# Patient Record
Sex: Female | Born: 1985 | Race: White | Hispanic: No | Marital: Married | Smoking: Former smoker
Health system: Southern US, Community
[De-identification: ages and names within clinical notes are randomized; demographics above are authoritative.]

## PROBLEM LIST (undated history)

## (undated) DIAGNOSIS — Z789 Other specified health status: Secondary | ICD-10-CM

## (undated) DIAGNOSIS — O4400 Placenta previa specified as without hemorrhage, unspecified trimester: Secondary | ICD-10-CM

## (undated) HISTORY — PX: NO PAST SURGERIES: SHX2092

## (undated) SURGERY — Surgical Case
Anesthesia: Regional

---
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
## 2008-05-11 ENCOUNTER — Emergency Department (HOSPITAL_COMMUNITY): Admission: EM | Admit: 2008-05-11 | Discharge: 2008-05-11 | Payer: Self-pay | Admitting: Emergency Medicine

## 2008-06-22 IMAGING — CR DG CHEST 2V
2 series · 2 of 2 positions shown · non-contrast
Comparison: None.

CLINICAL DATA: Chills, fever, cough. 
 CHEST - 2 VIEW:

[view not recorded (1 of 2)]
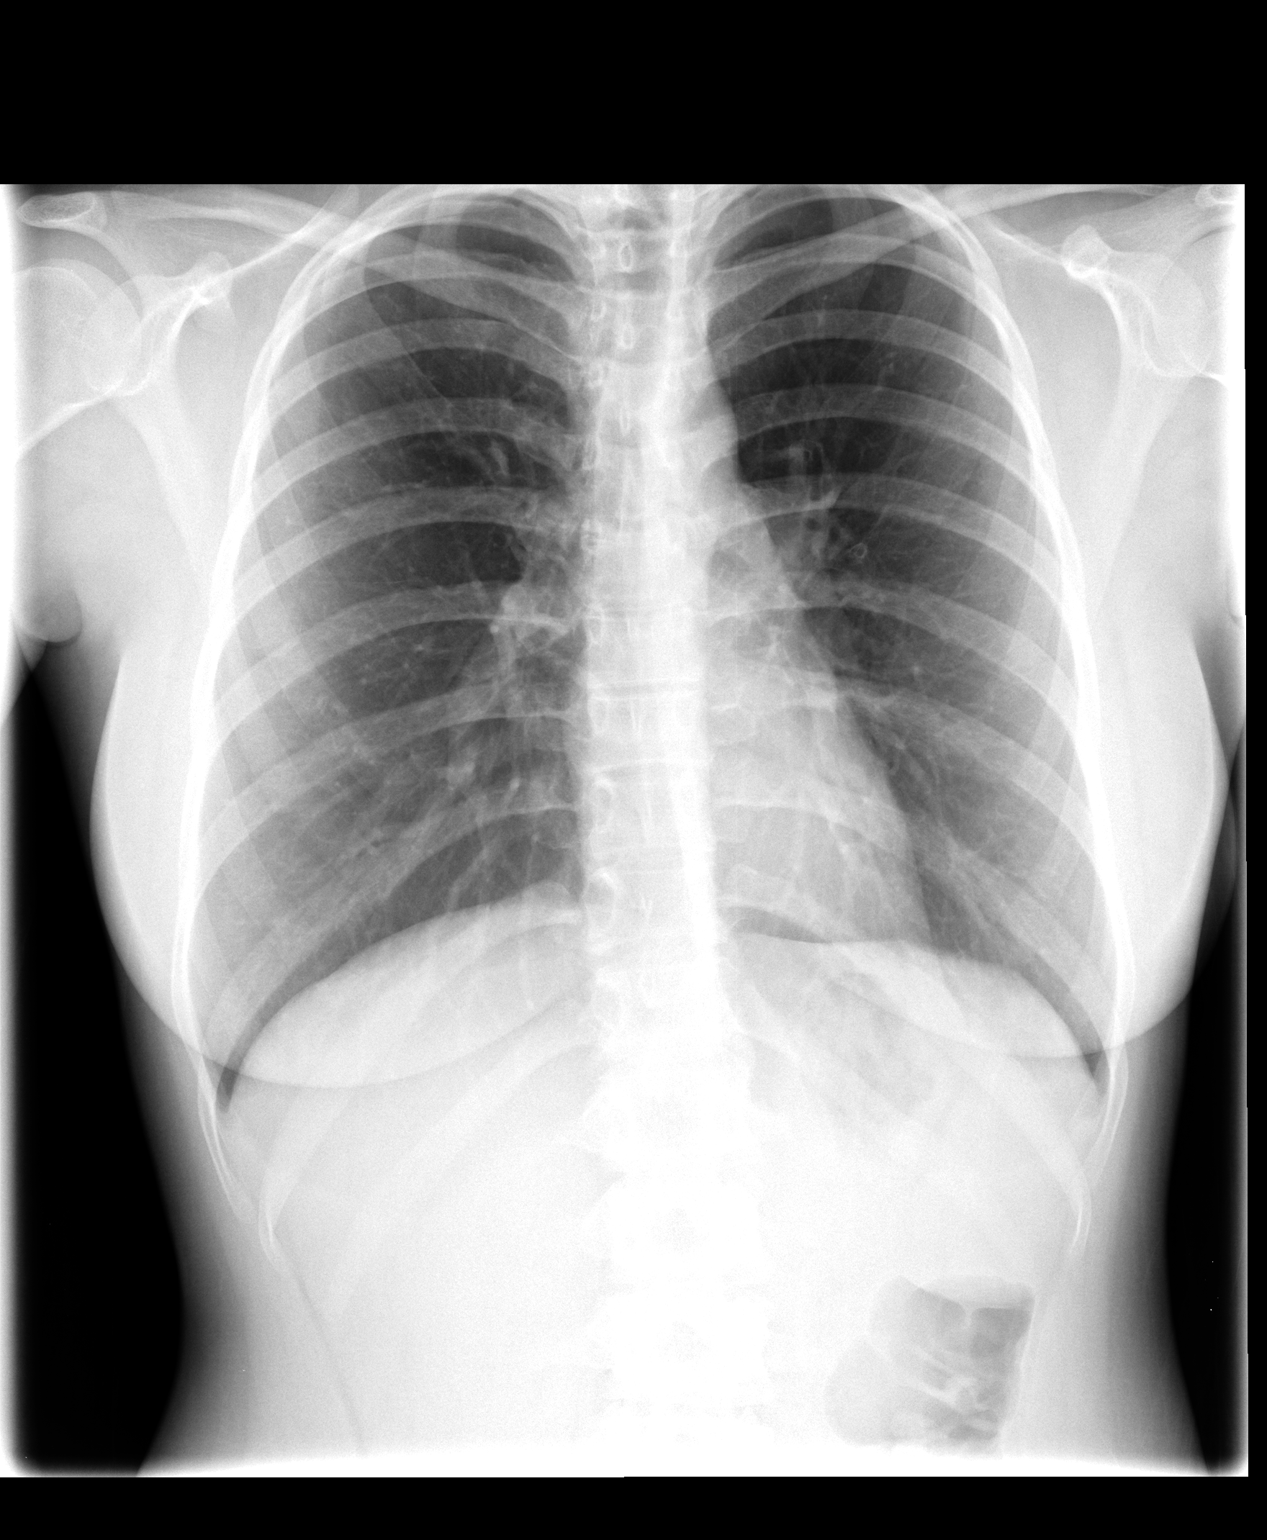

[view not recorded (2 of 2)]
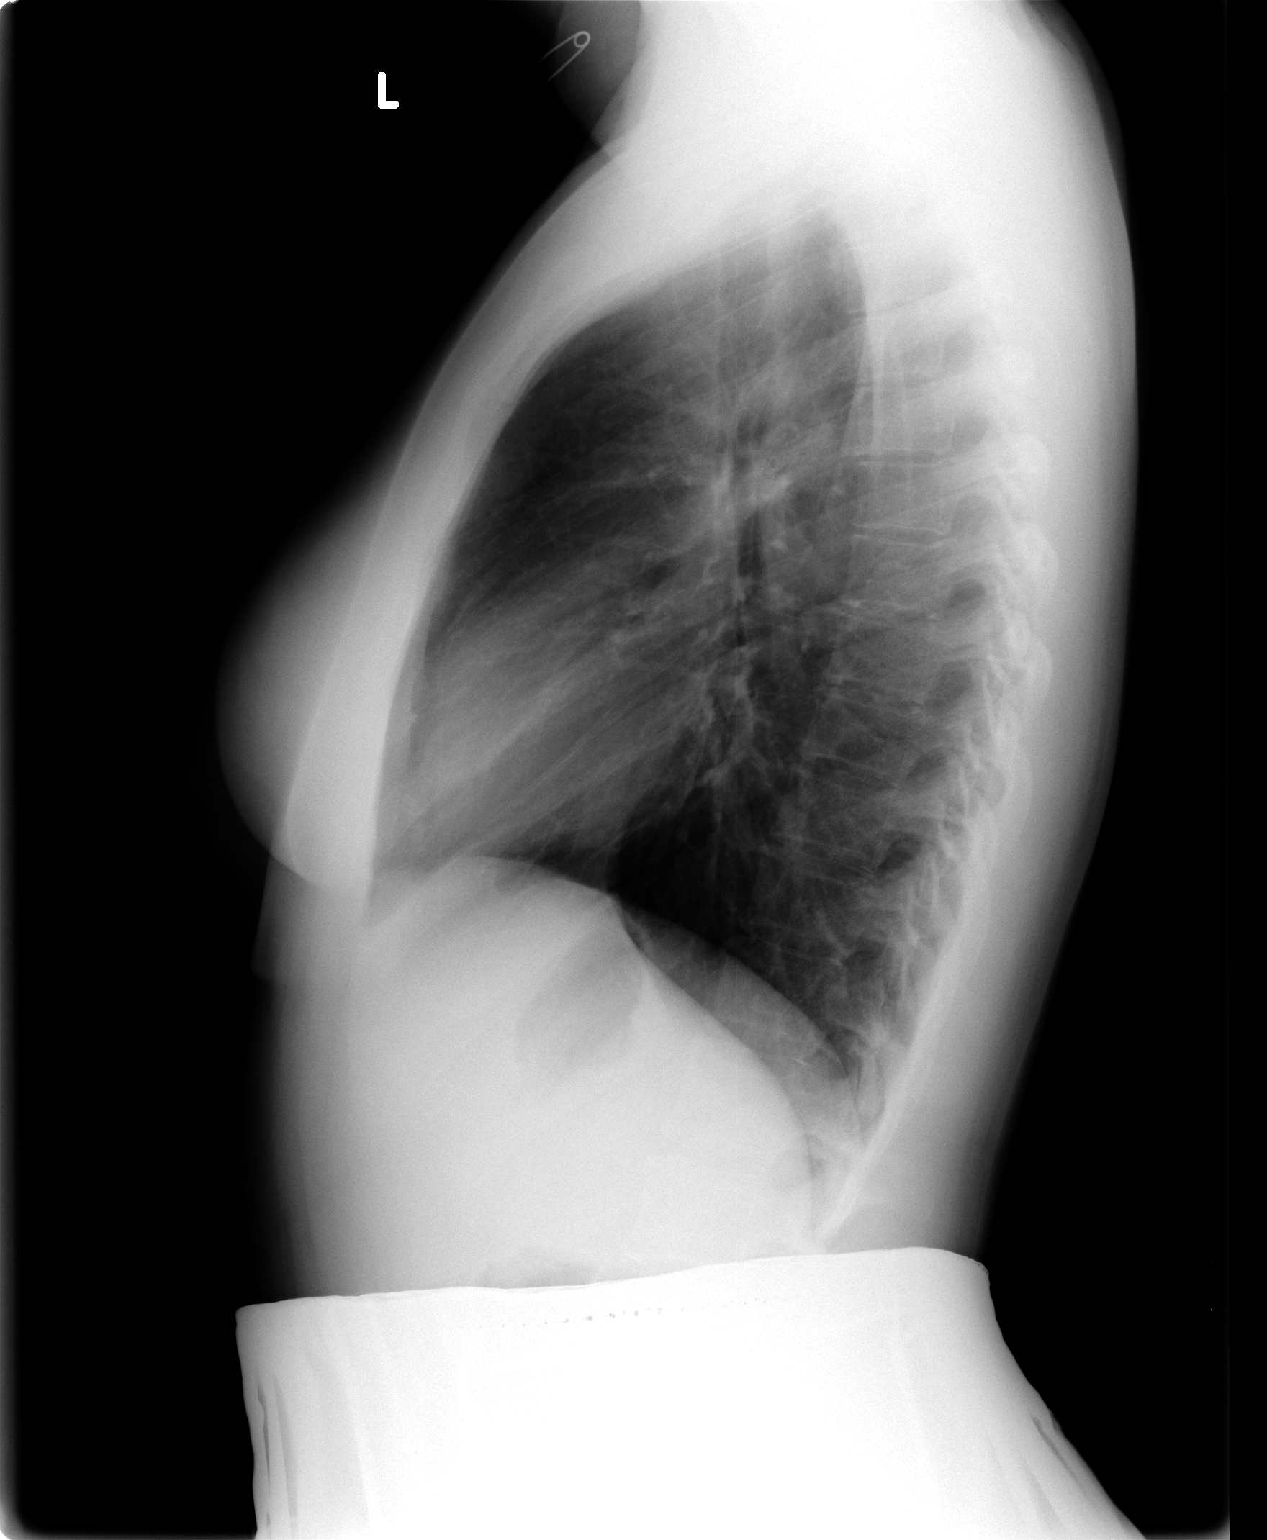

[2 of 2 positions shown; findings below may reference images not displayed]

FINDINGS: The heart size and mediastinal contours are within normal limits.  Both lungs are clear.  The visualized skeletal structures are unremarkable.  The abdomen and pelvis were shielded.
IMPRESSION: No active cardiopulmonary disease.

## 2011-02-26 LAB — ABO/RH: RH Type: POSITIVE

## 2011-05-11 LAB — RPR: RPR: NONREACTIVE

## 2011-05-11 LAB — HIV ANTIBODY (ROUTINE TESTING W REFLEX)
HIV: NONREACTIVE
HIV: NONREACTIVE

## 2011-05-11 LAB — RUBELLA ANTIBODY, IGM: Rubella: IMMUNE

## 2011-06-22 ENCOUNTER — Other Ambulatory Visit (HOSPITAL_COMMUNITY): Payer: Self-pay | Admitting: Obstetrics and Gynecology

## 2011-06-22 DIAGNOSIS — O4400 Placenta previa specified as without hemorrhage, unspecified trimester: Secondary | ICD-10-CM

## 2011-07-13 ENCOUNTER — Ambulatory Visit (HOSPITAL_COMMUNITY)
Admission: RE | Admit: 2011-07-13 | Discharge: 2011-07-13 | Disposition: A | Discharge status: Home / Self Care | Payer: Medicaid Other | Source: Ambulatory Visit | Attending: Obstetrics and Gynecology | Admitting: Obstetrics and Gynecology

## 2011-07-13 ENCOUNTER — Other Ambulatory Visit (HOSPITAL_COMMUNITY): Payer: Self-pay | Admitting: Obstetrics and Gynecology

## 2011-07-13 ENCOUNTER — Encounter (HOSPITAL_COMMUNITY): Payer: Self-pay

## 2011-07-13 DIAGNOSIS — Z3689 Encounter for other specified antenatal screening: Secondary | ICD-10-CM | POA: Insufficient documentation

## 2011-07-13 DIAGNOSIS — O4400 Placenta previa specified as without hemorrhage, unspecified trimester: Secondary | ICD-10-CM | POA: Insufficient documentation

## 2011-07-13 NOTE — Progress Notes (Signed)
See OB ultrasound report in [facility]

## 2011-07-15 ENCOUNTER — Encounter (HOSPITAL_COMMUNITY): Payer: Self-pay

## 2011-07-15 ENCOUNTER — Encounter (HOSPITAL_COMMUNITY)
Admission: RE | Admit: 2011-07-15 | Discharge: 2011-07-15 | Disposition: A | Discharge status: Home / Self Care | Payer: Medicaid Other | Source: Ambulatory Visit | Attending: Obstetrics and Gynecology | Admitting: Obstetrics and Gynecology

## 2011-07-15 HISTORY — DX: Complete placenta previa nos or without hemorrhage, unspecified trimester: O44.00

## 2011-07-15 HISTORY — DX: Other specified health status: Z78.9

## 2011-07-15 LAB — CBC
MCH: 31.1 pg (ref 26.0–34.0)
MCV: 91.4 fL (ref 78.0–100.0)
Platelets: 179 10*3/uL (ref 150–400)
RDW: 12.8 % (ref 11.5–15.5)

## 2011-07-15 LAB — SURGICAL PCR SCREEN: Staphylococcus aureus: NEGATIVE

## 2011-07-15 NOTE — Patient Instructions (Addendum)
   Your procedure is scheduled on: Friday  Enter through the Main Entrance of [facility] at: 6 AM Pick up the phone at the desk and dial [phone]  Please call this number if you have any problems the morning of surgery: [phone]  Remember: Do not eat food after midnight  Do not drink clear liquids after:midnight Take these medicines the morning of surgery with a SIP OF WATER:none  Do not wear jewelry, make-up, or FINGER nail polish Do not wear lotions, powders, or perfumes. Do not shave 48 hours prior to surgery. Do not bring valuables to the hospital. Leave suitcase in the car. After Surgery it may be brought to your room. For patients being admitted to the hospital, checkout time is 11:00am the day of discharge.     Remember to use your hibiclens as instructed.

## 2011-07-16 ENCOUNTER — Encounter (HOSPITAL_COMMUNITY): Payer: Self-pay | Admitting: Anesthesiology

## 2011-07-16 ENCOUNTER — Other Ambulatory Visit (HOSPITAL_COMMUNITY): Payer: Self-pay | Admitting: Anesthesiology

## 2011-07-16 ENCOUNTER — Other Ambulatory Visit: Payer: Self-pay | Admitting: Obstetrics and Gynecology

## 2011-07-16 MED ORDER — GENTAMICIN SULFATE 40 MG/ML IJ SOLN: Freq: Three times a day (TID) | INTRAVENOUS | Status: DC

## 2011-07-16 NOTE — H&P (Signed)
NAMELESSLIE, MOSSA              ACCOUNT NO.:  [id number]  MEDICAL RECORD NO.:  [id number]  LOCATION:                                 FACILITY:  PHYSICIAN:  Malachi Pro. Ambrose Mantle, M.D. DATE OF BIRTH:  Jun 21, 1986  DATE OF ADMISSION:  07/17/2011 DATE OF DISCHARGE:                             HISTORY & PHYSICAL   This is a 25 year old white female para 0, gravida 1, EDC August 04, 2011, admitted for C-section because of persistent placenta previa. Blood group and type O positive, negative antibody.  Pap smear normal, rubella immune, RPR nonreactive.  Urine culture negative.  Hepatitis B surface antigen negative, HIV negative, GC and Chlamydia negative.  She was too advanced for first trimester screening.  She declined second trimester screening as cystic fibrosis was declined.  An ultrasound on February 25, 2011, showed an estimated due date of August 04, 2011. Followup ultrasound on Mar 12, 2011, showed an estimated gestational age of [redacted] weeks and 3 days with an [facility] of August 03, 2011.  At that time, it was thought that she had placenta previa.  A repeat ultrasound on April 09, 2011, showed persistence of the placenta previa.  An ultrasound on May 11, 2011, at 28 weeks and 3 days by ultrasound showed persistence of the placenta previa.  Ultrasound on June 08, 2011, again showed posterior placenta with continued presence of a placenta previa.  Since her ultrasounds had been done in our office, I requested that she be scanned at maternal fetal medicine and on July 13, 2011, she was scanned at maternal fetal medicine with findings of persistent placenta previa and Dr. Rachel Bo suggested delivering the baby by C-section at 37 weeks.  The actual recommendations are to deliver the patients with placenta previa at 36-37 weeks, so she is admitted now for the C- section.  Since her first visit at 16-5/7 weeks' gestation, she has not had any particular significant problems.  She did have pores and  IV, and she was advised to use over-the-counter cream or spray to help with the itching.  The 1-hour Glucola test was 137.  She had a normal 3-hour GTT. The poison ivy gradually dried up.  Group B strep culture was negative. On her last prenatal visit on July 16, 2011, fetal heart tones were normal.  Fundal height was 37 cm.  PAST MEDICAL HISTORY:  Reveals allergy to PENICILLIN, caused hives. ALOE also caused rash and itching.  She has had no surgical procedures other than having frequent UTIs.  She has no significant medical history.  FAMILY HISTORY:  Maternal grandfather ALS, maternal grandfather diabetes.  Mother with high blood pressure and maternal great grandmother with an MI.  SOCIAL HISTORY:  The patient denies alcohol, illicit substance abuse. She did quit smoking in February 2012.  She did smoke one-half pack per day.  She is a Occupational psychologist at [employer].  PHYSICAL EXAM:  GENERAL:  On admission revealed a well-developed, well- nourished white female, in no distress. VITAL SIGNS:  Blood pressure was 120/78, pulse was 80. HEAD, EYES, NOSE, AND THROAT:  Normal. HEART:  Normal size and sounds.  No murmurs. LUNGS:  Clear to auscultation. ABDOMEN:  Soft.  Fundal height 37 cm.  Fetal heart tones normal.  Cervix is not examined.  ADMITTING IMPRESSION:  Intrauterine pregnancy at 37 weeks.  The patient has placenta previa that is persistent on 4 or 5 ultrasound examinations, the last on July 13, 2011.  She has had no bleeding. The patient is admitted for C-section.  She understands that there is an increased risk of hemorrhage and even possibly loss of her uterus.  She understands and agrees to proceed with the surgery.     Malachi Pro. Ambrose Mantle, M.D.     TFH/MEDQ  D:  07/16/2011  T:  07/16/2011  Job:  [id number]

## 2011-07-17 ENCOUNTER — Encounter (HOSPITAL_COMMUNITY): Payer: Self-pay | Admitting: Anesthesiology

## 2011-07-17 ENCOUNTER — Inpatient Hospital Stay (HOSPITAL_COMMUNITY): Payer: Medicaid Other | Admitting: Anesthesiology

## 2011-07-17 ENCOUNTER — Encounter (HOSPITAL_COMMUNITY): Payer: Self-pay | Admitting: *Deleted

## 2011-07-17 ENCOUNTER — Encounter (HOSPITAL_COMMUNITY)
Admission: RE | Disposition: A | Discharge status: Home / Self Care | Payer: Self-pay | Source: Ambulatory Visit | Attending: Obstetrics and Gynecology

## 2011-07-17 ENCOUNTER — Inpatient Hospital Stay (HOSPITAL_COMMUNITY)
Admission: RE | Admit: 2011-07-17 | Discharge: 2011-07-20 | DRG: 766 | Disposition: A | Discharge status: Home / Self Care | Payer: Medicaid Other | Source: Ambulatory Visit | Attending: Obstetrics and Gynecology | Admitting: Obstetrics and Gynecology

## 2011-07-17 DIAGNOSIS — Z01812 Encounter for preprocedural laboratory examination: Secondary | ICD-10-CM

## 2011-07-17 DIAGNOSIS — O4410 Placenta previa with hemorrhage, unspecified trimester: Principal | ICD-10-CM | POA: Diagnosis present

## 2011-07-17 DIAGNOSIS — Z01818 Encounter for other preprocedural examination: Secondary | ICD-10-CM

## 2011-07-17 LAB — COMPREHENSIVE METABOLIC PANEL
ALT: 14 U/L (ref 0–35)
AST: 25 U/L (ref 0–37)
Alkaline Phosphatase: 160 U/L — ABNORMAL HIGH (ref 39–117)
CO2: 22 mEq/L (ref 19–32)
Chloride: 104 mEq/L (ref 96–112)
GFR calc non Af Amer: >60 mL/min (ref >60)
Potassium: 3.8 mEq/L (ref 3.5–5.1)
Sodium: 135 mEq/L (ref 135–145)
Total Bilirubin: 0.5 mg/dL (ref 0.3–1.2)

## 2011-07-17 LAB — URINALYSIS, ROUTINE W REFLEX MICROSCOPIC
Bilirubin Urine: NEGATIVE
Protein, ur: NEGATIVE mg/dL
Urobilinogen, UA: 0.2 mg/dL (ref 0.0–1.0)

## 2011-07-17 LAB — TYPE AND SCREEN

## 2011-07-17 SURGERY — Surgical Case
Anesthesia: Spinal | Site: Abdomen | Wound class: Clean Contaminated

## 2011-07-17 MED ORDER — DEXTROSE 5 % IV SOLN: INTRAVENOUS | Status: DC | PRN

## 2011-07-17 MED ORDER — ONDANSETRON HCL 4 MG PO TABS: 4.0000 mg | ORAL_TABLET | ORAL | Status: DC | PRN

## 2011-07-17 MED ORDER — LACTATED RINGERS IV SOLN
INTRAVENOUS | Status: DC
  Administered 2011-07-17 (×5): via INTRAVENOUS

## 2011-07-17 MED ORDER — SCOPOLAMINE 1 MG/3DAYS TD PT72: 1.0000 | MEDICATED_PATCH | Freq: Once | TRANSDERMAL | Status: DC

## 2011-07-17 MED ORDER — MEASLES, MUMPS & RUBELLA VAC ~~LOC~~ INJ: 0.5000 mL | INJECTION | Freq: Once | SUBCUTANEOUS | Status: DC

## 2011-07-17 MED ORDER — ONDANSETRON HCL 4 MG/2ML IJ SOLN
INTRAMUSCULAR | Status: DC | PRN
  Administered 2011-07-17: 4 mg via INTRAVENOUS

## 2011-07-17 MED ORDER — DIPHENHYDRAMINE HCL 50 MG/ML IJ SOLN: 12.5000 mg | INTRAMUSCULAR | Status: DC | PRN

## 2011-07-17 MED ORDER — SENNOSIDES-DOCUSATE SODIUM 8.6-50 MG PO TABS
2.0000 | ORAL_TABLET | Freq: Every day | ORAL | Status: DC
  Administered 2011-07-17: 1 via ORAL
  Administered 2011-07-18 – 2011-07-19 (×2): 2 via ORAL

## 2011-07-17 MED ORDER — MENTHOL 3 MG MT LOZG
1.0000 | LOZENGE | OROMUCOSAL | Status: DC | PRN
Start: 2011-07-17 — End: 2011-07-20

## 2011-07-17 MED ORDER — DIPHENHYDRAMINE HCL 25 MG PO CAPS: 25.0000 mg | ORAL_CAPSULE | Freq: Four times a day (QID) | ORAL | Status: DC | PRN

## 2011-07-17 MED ORDER — EPHEDRINE SULFATE 50 MG/ML IJ SOLN
INTRAMUSCULAR | Status: DC | PRN
  Administered 2011-07-17 (×2): 5 mg via INTRAVENOUS
  Administered 2011-07-17: 10 mg via INTRAVENOUS
  Administered 2011-07-17: 5 mg via INTRAVENOUS

## 2011-07-17 MED ORDER — DIPHENHYDRAMINE HCL 25 MG PO CAPS: 25.0000 mg | ORAL_CAPSULE | ORAL | Status: DC | PRN

## 2011-07-17 MED ORDER — SCOPOLAMINE 1 MG/3DAYS TD PT72
MEDICATED_PATCH | TRANSDERMAL | Status: AC
  Filled 2011-07-17: qty 1

## 2011-07-17 MED ORDER — DIPHENHYDRAMINE HCL 50 MG/ML IJ SOLN: 25.0000 mg | INTRAMUSCULAR | Status: DC | PRN

## 2011-07-17 MED ORDER — ONDANSETRON HCL 4 MG/2ML IJ SOLN: 4.0000 mg | Freq: Three times a day (TID) | INTRAMUSCULAR | Status: DC | PRN

## 2011-07-17 MED ORDER — KETOROLAC TROMETHAMINE 30 MG/ML IJ SOLN: 30.0000 mg | Freq: Four times a day (QID) | INTRAMUSCULAR | Status: AC | PRN

## 2011-07-17 MED ORDER — SIMETHICONE 80 MG PO CHEW: 80.0000 mg | CHEWABLE_TABLET | ORAL | Status: DC | PRN

## 2011-07-17 MED ORDER — ZOLPIDEM TARTRATE 5 MG PO TABS: 5.0000 mg | ORAL_TABLET | Freq: Every evening | ORAL | Status: DC | PRN

## 2011-07-17 MED ORDER — SCOPOLAMINE 1 MG/3DAYS TD PT72
1.0000 | MEDICATED_PATCH | Freq: Once | TRANSDERMAL | Status: DC
  Administered 2011-07-17: 1.5 mg via TRANSDERMAL

## 2011-07-17 MED ORDER — IBUPROFEN 600 MG PO TABS
600.0000 mg | ORAL_TABLET | Freq: Four times a day (QID) | ORAL | Status: DC | PRN
  Administered 2011-07-18 – 2011-07-20 (×10): 600 mg via ORAL
  Filled 2011-07-17 (×11): qty 1

## 2011-07-17 MED ORDER — OXYTOCIN 10 UNIT/ML IJ SOLN
INTRAMUSCULAR | Status: AC
  Filled 2011-07-17: qty 2

## 2011-07-17 MED ORDER — EPHEDRINE 5 MG/ML INJ
INTRAVENOUS | Status: AC
  Filled 2011-07-17: qty 10

## 2011-07-17 MED ORDER — OXYTOCIN 20 UNITS IN LACTATED RINGERS INFUSION - SIMPLE: 125.0000 mL/h | INTRAVENOUS | Status: AC

## 2011-07-17 MED ORDER — METOCLOPRAMIDE HCL 5 MG/ML IJ SOLN: 10.0000 mg | Freq: Three times a day (TID) | INTRAMUSCULAR | Status: DC | PRN

## 2011-07-17 MED ORDER — MORPHINE SULFATE (PF) 0.5 MG/ML IJ SOLN
INTRAMUSCULAR | Status: DC | PRN
  Administered 2011-07-17: .1 mg via INTRATHECAL

## 2011-07-17 MED ORDER — NALOXONE HCL 0.4 MG/ML IJ SOLN: 0.4000 mg | INTRAMUSCULAR | Status: DC | PRN

## 2011-07-17 MED ORDER — HYDROMORPHONE HCL 1 MG/ML IJ SOLN: 0.2500 mg | INTRAMUSCULAR | Status: DC | PRN

## 2011-07-17 MED ORDER — GENTAMICIN SULFATE 40 MG/ML IJ SOLN
Freq: Once | INTRAVENOUS | Status: DC
  Filled 2011-07-17: qty 3

## 2011-07-17 MED ORDER — LACTATED RINGERS IV SOLN
INTRAVENOUS | Status: DC
  Administered 2011-07-17: 13:00:00 via INTRAVENOUS

## 2011-07-17 MED ORDER — GENTAMICIN SULFATE 40 MG/ML IJ SOLN
INTRAVENOUS | Status: DC | PRN
  Administered 2011-07-17: 106 mL via INTRAVENOUS

## 2011-07-17 MED ORDER — FENTANYL CITRATE 0.05 MG/ML IJ SOLN
INTRAMUSCULAR | Status: AC
  Filled 2011-07-17: qty 2

## 2011-07-17 MED ORDER — ONDANSETRON HCL 4 MG/2ML IJ SOLN
INTRAMUSCULAR | Status: AC
  Filled 2011-07-17: qty 2

## 2011-07-17 MED ORDER — BUPIVACAINE IN DEXTROSE 0.75-8.25 % IT SOLN
INTRATHECAL | Status: DC | PRN
  Administered 2011-07-17: 11.25 mg via INTRATHECAL

## 2011-07-17 MED ORDER — LACTATED RINGERS IV SOLN: INTRAVENOUS | Status: DC

## 2011-07-17 MED ORDER — MORPHINE SULFATE 0.5 MG/ML IJ SOLN
INTRAMUSCULAR | Status: AC
  Filled 2011-07-17: qty 10

## 2011-07-17 MED ORDER — PHENYLEPHRINE HCL 10 MG/ML IJ SOLN
INTRAMUSCULAR | Status: DC | PRN
  Administered 2011-07-17: 80 ug via INTRAVENOUS
  Administered 2011-07-17 (×2): 40 ug via INTRAVENOUS
  Administered 2011-07-17: 80 ug via INTRAVENOUS

## 2011-07-17 MED ORDER — NALBUPHINE SYRINGE 5 MG/0.5 ML: 5.0000 mg | INJECTION | INTRAMUSCULAR | Status: DC | PRN

## 2011-07-17 MED ORDER — SIMETHICONE 80 MG PO CHEW
80.0000 mg | CHEWABLE_TABLET | Freq: Three times a day (TID) | ORAL | Status: DC
  Administered 2011-07-17 – 2011-07-20 (×10): 80 mg via ORAL

## 2011-07-17 MED ORDER — FENTANYL CITRATE 0.05 MG/ML IJ SOLN
INTRAMUSCULAR | Status: DC | PRN
  Administered 2011-07-17: 15 ug via INTRATHECAL

## 2011-07-17 MED ORDER — IBUPROFEN 600 MG PO TABS
600.0000 mg | ORAL_TABLET | Freq: Four times a day (QID) | ORAL | Status: DC | PRN
  Administered 2011-07-17: 600 mg via ORAL
  Filled 2011-07-17: qty 1

## 2011-07-17 MED ORDER — SODIUM CHLORIDE 0.9 % IV SOLN
1.0000 ug/kg/h | INTRAVENOUS | Status: DC | PRN
  Filled 2011-07-17: qty 2.5

## 2011-07-17 MED ORDER — MEPERIDINE HCL 25 MG/ML IJ SOLN: 6.2500 mg | INTRAMUSCULAR | Status: DC | PRN

## 2011-07-17 MED ORDER — SODIUM CHLORIDE 0.9 % IV SOLN: INTRAVENOUS | Status: DC

## 2011-07-17 MED ORDER — OXYCODONE-ACETAMINOPHEN 5-325 MG PO TABS
1.0000 | ORAL_TABLET | Freq: Four times a day (QID) | ORAL | Status: DC | PRN
  Administered 2011-07-18 (×3): 1 via ORAL
  Administered 2011-07-18 – 2011-07-19 (×4): 2 via ORAL
  Administered 2011-07-19: 1 via ORAL
  Administered 2011-07-20 (×2): 2 via ORAL
  Filled 2011-07-17: qty 2
  Filled 2011-07-17: qty 1
  Filled 2011-07-17 (×2): qty 2
  Filled 2011-07-17: qty 1
  Filled 2011-07-17: qty 2
  Filled 2011-07-17 (×2): qty 1
  Filled 2011-07-17 (×2): qty 2

## 2011-07-17 MED ORDER — DIBUCAINE 1 % RE OINT: 1.0000 "application " | TOPICAL_OINTMENT | RECTAL | Status: DC | PRN

## 2011-07-17 MED ORDER — OXYTOCIN 10 UNIT/ML IJ SOLN
INTRAMUSCULAR | Status: AC
  Filled 2011-07-17: qty 4

## 2011-07-17 MED ORDER — SODIUM CHLORIDE 0.9 % IJ SOLN: 3.0000 mL | INTRAMUSCULAR | Status: DC | PRN

## 2011-07-17 MED ORDER — TETANUS-DIPHTH-ACELL PERTUSSIS 5-2.5-18.5 LF-MCG/0.5 IM SUSP
0.5000 mL | Freq: Once | INTRAMUSCULAR | Status: AC
  Administered 2011-07-18: 0.5 mL via INTRAMUSCULAR
  Filled 2011-07-17: qty 0.5

## 2011-07-17 MED ORDER — GENTAMICIN SULFATE 40 MG/ML IJ SOLN
Freq: Three times a day (TID) | INTRAVENOUS | Status: AC
  Administered 2011-07-17 (×2): via INTRAVENOUS
  Filled 2011-07-17 (×2): qty 3

## 2011-07-17 MED ORDER — KETOROLAC TROMETHAMINE 60 MG/2ML IM SOLN
60.0000 mg | Freq: Once | INTRAMUSCULAR | Status: AC | PRN
  Filled 2011-07-17: qty 2

## 2011-07-17 MED ORDER — LANOLIN HYDROUS EX OINT: 1.0000 "application " | TOPICAL_OINTMENT | CUTANEOUS | Status: DC | PRN

## 2011-07-17 MED ORDER — PHENYLEPHRINE 40 MCG/ML (10ML) SYRINGE FOR IV PUSH (FOR BLOOD PRESSURE SUPPORT)
PREFILLED_SYRINGE | INTRAVENOUS | Status: AC
  Filled 2011-07-17: qty 5

## 2011-07-17 MED ORDER — ONDANSETRON HCL 4 MG/2ML IJ SOLN: 4.0000 mg | INTRAMUSCULAR | Status: DC | PRN

## 2011-07-17 MED ORDER — OXYTOCIN 20 UNITS IN LACTATED RINGERS INFUSION - SIMPLE
INTRAVENOUS | Status: DC | PRN
  Administered 2011-07-17 (×2): 20 [IU] via INTRAVENOUS

## 2011-07-17 MED ORDER — PHENYLEPHRINE 40 MCG/ML (10ML) SYRINGE FOR IV PUSH (FOR BLOOD PRESSURE SUPPORT)
PREFILLED_SYRINGE | INTRAVENOUS | Status: AC
  Filled 2011-07-17: qty 15

## 2011-07-17 SURGICAL SUPPLY — 7 items
DRESSING TELFA 8X3 (GAUZE/BANDAGES/DRESSINGS) ×2 IMPLANT
DRSG VASELINE 3X18 (GAUZE/BANDAGES/DRESSINGS) ×2 IMPLANT
GAUZE SPONGE 4X4 12PLY STRL LF (GAUZE/BANDAGES/DRESSINGS) ×2 IMPLANT
PAD ABD 7.5X8 STRL (GAUZE/BANDAGES/DRESSINGS) ×2 IMPLANT
RETRACTOR WND ALEXIS 25 LRG (MISCELLANEOUS) ×1 IMPLANT
RTRCTR WOUND ALEXIS 25CM LRG (MISCELLANEOUS) ×2
STAPLER VISISTAT 35W (STAPLE) ×2 IMPLANT

## 2011-07-17 NOTE — Anesthesia Preprocedure Evaluation (Signed)
Anesthesia Evaluation  Name, MR# and DOB Patient awake  General Assessment Comment  Reviewed: Allergy & Precautions, H&P , Patient's Chart, lab work & pertinent test results  History of Anesthesia Complications (+) PONV  Airway Mallampati: II TM Distance: >3 FB Neck ROM: Full    Dental No notable dental hx. (+) Teeth Intact   Pulmonary  clear to auscultation  pulmonary exam normalPulmonary Exam Normal breath sounds clear to auscultation none    Cardiovascular Regular Normal    Neuro/Psych Negative Neurological ROS  Negative Psych ROS  GI/Hepatic/Renal negative GI ROS  negative Liver ROS  negative Renal ROS        Endo/Other  Negative Endocrine ROS (+)      Abdominal   Musculoskeletal   Hematology negative hematology ROS (+)   Peds  Reproductive/Obstetrics (+) Pregnancy    Anesthesia Other Findings             Anesthesia Physical Anesthesia Plan  ASA: II  Anesthesia Plan: Spinal   Post-op Pain Management:    Induction:   Airway Management Planned:   Additional Equipment:   Intra-op Plan:   Post-operative Plan:   Informed Consent: I have reviewed the patients History and Physical, chart, labs and discussed the procedure including the risks, benefits and alternatives for the proposed anesthesia with the patient or authorized representative who has indicated his/her understanding and acceptance.     Plan Discussed with: Anesthesiologist  Anesthesia Plan Comments:         Anesthesia Quick Evaluation

## 2011-07-17 NOTE — Anesthesia Postprocedure Evaluation (Signed)
Anesthesia Post Note  Patient: Nancy Nicholson  Procedure(s) Performed:  CESAREAN SECTION - Primary Cesarean Section  Anesthesia type: Spinal  Patient location: PACU  Post pain: Pain level controlled  Post assessment: Post-op Vital signs reviewed  Last Vitals:  Filed Vitals:   07/17/11 0915  BP: 120/71  Pulse: 112  Temp:   Resp: 18    Post vital signs: Reviewed  Level of consciousness: awake  Complications: No apparent anesthesia complications

## 2011-07-17 NOTE — Anesthesia Procedure Notes (Signed)
Spinal Block  Patient location during procedure: OR Start time: 07/17/2011 7:39 AM Staffing Performed by: anesthesiologist  Preanesthetic Checklist Completed: patient identified, site marked, surgical consent, pre-op evaluation, timeout performed, IV checked, risks and benefits discussed and monitors and equipment checked Spinal Block Patient position: sitting Prep: site prepped and draped and DuraPrep Patient monitoring: heart rate, cardiac monitor, continuous pulse ox and blood pressure Approach: midline Location: L3-4 Injection technique: single-shot Needle Needle type: Sprotte  Needle gauge: 24 G Needle length: 9 cm Assessment Sensory level: T4

## 2011-07-17 NOTE — Progress Notes (Signed)
Baby back in to breastfeed with mother due to hypglycemia, difficulty taking the breast, will try to continue bf at bedside in room

## 2011-07-17 NOTE — Progress Notes (Signed)
  No major change in health since H&P. 

## 2011-07-17 NOTE — Progress Notes (Signed)
UR chart review completed.  

## 2011-07-17 NOTE — Progress Notes (Signed)
Op note on [name] Goeden: Date of the operation: 07/17/2011  Preoperative diagnosis: Intrauterine pregnancy 37 weeks placenta previa  Postoperative diagnosis: Same  Operation: Low transverse cervical cesarean section  Operator: Rennie Hack Asst.: Meisinger  Anesthesia: Spinal Dr. Rodman Pickle  Estimated blood loss: 1200 cc   The patient was brought to the operating room and given a spinal anesthetic and placed in the left lateral tilt position. She was frog-legged for insertion of a Foley catheter. The abdomen and urethra were prepped with Betadine solution. A Foley catheter was inserted to drain. The abdomen was draped as a sterile field. Anesthesia was confirmed by pinching the lower abdomen with an Allis clamp. A transverse incision was made suprapubically and carried in layers through the skin subcutaneous tissue and fascia. The fascia was separated from the rectus muscles superiorly and inferiorly. A midline was developed and the peritoneum was opened. The peritoneum was stretched and an Network engineer was placed into the abdominal cavity. The lower uterine segment was exposed and a superficial incision was made transversely into the myometrium. I went the rest of the way into the amniotic sac with my finger and got clear fluid. The incision was stretched superiorly and inferiorly and the vertex easily delivered through the incisional opening. The nose and pharynx were suctioned with the bulb, the cord was clamped, and the infant was given to Dr. Marthann Schiller who was in attendance. The infant was 6 lbs. 5 oz. Female Apgars were 9 and 9 assigned by Dr. Katrinka Blazing.  It was known that the patient had a posterior placenta previa. I obtain cord blood and then began removing the placenta from the fundus downward . There was A complete placenta previa. After the placenta was removed Pitocin was begun, and the uterus was massaged. There was some bleeding from the lower uterine segment posteriorly. I tried didn't stop  this bleeding with multiple interrupted figure-of-eight sutures of 0 Vicryl and 3-0 Vicryl and also used pressure. The bleeding appeared to be under control so the uterine incision was closed with 2 running sutures of 0 Vicryl, locking the first layer and nonlocking on the second layer. The uterus tubes and ovaries appeared normal. Liberal irrigation confirmed hemostasis. The abdominal wall was closed in layers, using interrupted sutures of 0 Vicryl including the rectus muscle and the peritoneum in one layer, 2 running sutures of 0 Vicryl on the fascia, And 3-0 Vicryl on the subcutaneous tissue. The skin was closed with automatic staples. The patient seemed to tolerate the procedure well and was returned to recovery in satisfactory condition. After the sterile dressing was applied to the incision the uterus was massaged and there was minimal bleeding from the vagina.

## 2011-07-17 NOTE — Transfer of Care (Signed)
Immediate Anesthesia Transfer of Care Note  Patient: Nancy Nicholson  Procedure(s) Performed:  CESAREAN SECTION - Primary Cesarean Section  Patient Location: PACU  Anesthesia Type: Spinal  Level of Consciousness: awake, alert  and oriented  Airway & Oxygen Therapy: Patient Spontanous Breathing  Post-op Assessment: Report given to PACU RN and Post -op Vital signs reviewed and stable  Post vital signs: Reviewed and stable  Complications: No apparent anesthesia complications

## 2011-07-18 LAB — CBC
Hemoglobin: 7.9 g/dL — ABNORMAL LOW (ref 12.0–15.0)
MCHC: 34.6 g/dL (ref 30.0–36.0)
Platelets: 129 10*3/uL — ABNORMAL LOW (ref 150–400)
RDW: 13.2 % (ref 11.5–15.5)

## 2011-07-18 MED ORDER — INFLUENZA VIRUS VACC SPLIT PF IM SUSP: 0.5000 mL | Freq: Once | INTRAMUSCULAR | Status: DC

## 2011-07-18 MED ORDER — INFLUENZA VIRUS VACC SPLIT PF IM SUSP
0.5000 mL | Freq: Once | INTRAMUSCULAR | Status: AC
  Administered 2011-07-19: 0.5 mL via INTRAMUSCULAR
  Filled 2011-07-18: qty 0.5

## 2011-07-18 MED ORDER — INFLUENZA VIRUS VACC SPLIT PF IM SUSP
0.5000 mL | Freq: Once | INTRAMUSCULAR | Status: DC
  Filled 2011-07-18: qty 0.5

## 2011-07-18 NOTE — Progress Notes (Signed)
Subjective: Postpartum Day 1: Cesarean Delivery Patient reports incisional pain, tolerating PO and no problems voiding.  Nl lochia, pain controlled  Objective: Vital signs in last 24 hours: Temp:  [97.8 F (36.6 C)-98.5 F (36.9 C)] 98.1 F (36.7 C) (09/15 0815) Pulse Rate:  [79-111] 111  (09/15 0815) Resp:  [16-30] 30  (09/15 0815) BP: (98-116)/(53-75) 109/53 mmHg (09/15 0815) SpO2:  [95 %-100 %] 98 % (09/15 0815) Weight:  [68.04 kg (150 lb)] 150 lb (68.04 kg) (09/14 1300)  Physical Exam:  General: alert and no distress Lochia: appropriate Uterine Fundus: firm Incision: healing well DVT Evaluation: No evidence of DVT seen on physical exam.   Basename 07/18/11 0522 07/15/11 1337  HGB 7.9* 11.2*  HCT 22.8* 32.9*    Assessment/Plan: Status post Cesarean section. Doing well postoperatively.  Continue current care.  BOVARD,Laure Leone 07/18/2011, 9:53 AM

## 2011-07-19 NOTE — Progress Notes (Signed)
Subjective: Postpartum Day 2: Cesarean Delivery Patient reports incisional pain, tolerating PO and + flatus.  Pain controlled, nl lochia  Objective: Vital signs in last 24 hours: Temp:  [98.1 F (36.7 C)-98.2 F (36.8 C)] 98.2 F (36.8 C) (09/16 0557) Pulse Rate:  [81-102] 81  (09/16 0557) Resp:  [18] 18  (09/16 0557) BP: (100-102)/(65-67) 101/66 mmHg (09/16 0557)  Physical Exam:  General: alert and no distress Lochia: appropriate Uterine Fundus: firm Incision: healing well DVT Evaluation: No evidence of DVT seen on physical exam.   Basename 07/18/11 0522  HGB 7.9*  HCT 22.8*    Assessment/Plan: Status post Cesarean section. Doing well postoperatively.  Continue current care.  BOVARD,Fina Heizer 07/19/2011, 9:48 AM

## 2011-07-20 MED ORDER — OXYCODONE-ACETAMINOPHEN 5-325 MG PO TABS: 1.0000 | ORAL_TABLET | Freq: Four times a day (QID) | ORAL | Status: AC | PRN

## 2011-07-20 MED ORDER — IBUPROFEN 600 MG PO TABS: 600.0000 mg | ORAL_TABLET | Freq: Four times a day (QID) | ORAL | Status: AC | PRN

## 2011-07-20 NOTE — Progress Notes (Signed)
#   3 afebrile for discharge. Staples out strips applied.

## 2011-07-20 NOTE — Discharge Summary (Signed)
Nancy Nicholson, Nancy Nicholson              ACCOUNT NO.:  [id number]  MEDICAL RECORD NO.:  [id number]  LOCATION:  9122                          FACILITY:  [facility]  PHYSICIAN:  Malachi Pro. Ambrose Mantle, M.D. DATE OF BIRTH:  July 24, 1986  DATE OF ADMISSION:  07/17/2011 DATE OF DISCHARGE:  07/20/2011                              DISCHARGE SUMMARY   HISTORY OF PRESENT ILLNESS:  This is a 25 year old white female admitted for C-section because of persistent placenta previa.  The patient underwent the cesarean section by Dr. Ambrose Mantle with Dr. Jackelyn Knife assisting under spinal anesthesia.  There was bleeding from the posterior lower uterine segment that was sutured with adequate control of the bleeding.  Postoperatively, she did well.  She met her guidelines of tolerating a diet, passing flatus, remaining afebrile, staples removed on the third postop day, and she was discharged.  LABORATORY DATA:  Initial hemoglobin of 11.2, hematocrit 32.9, white count 9200, and platelet count 179,000.  Followup hemoglobin 7.9, hematocrit 22.8, and platelet count 129,000.  Urinalysis was negative. MSRA was negative.  Staph aureus negative.  RPR nonreactive.  FINAL DIAGNOSIS:  Intrauterine pregnancy at 37 weeks delivered vertex by cesarean section, placenta previa.  OPERATION:  Low-transverse cervical cesarean section.  FINAL CONDITION:  Improved.  INSTRUCTIONS:  Our regular discharge instruction booklet.  Percocet 5/325, 30 tablets, one every 4-6 hours as needed for pain and Motrin 600 mg, 30 tablets, one every 6 hours as needed for pain.  The patient is advised to continue her iron pills as well as her prenatal vitamins and return to the office in 2 weeks for followup examination.     Malachi Pro. Ambrose Mantle, M.D.     TFH/MEDQ  D:  07/20/2011  T:  07/20/2011  Job:  [id number]

## 2011-07-30 ENCOUNTER — Encounter (HOSPITAL_COMMUNITY): Payer: Self-pay | Admitting: Obstetrics and Gynecology

## 2011-08-10 ENCOUNTER — Encounter (HOSPITAL_COMMUNITY): Payer: Self-pay | Admitting: *Deleted

## 2014-09-03 ENCOUNTER — Encounter (HOSPITAL_COMMUNITY): Payer: Self-pay | Admitting: *Deleted

## 2015-07-07 ENCOUNTER — Ambulatory Visit (INDEPENDENT_AMBULATORY_CARE_PROVIDER_SITE_OTHER): Payer: 59 | Admitting: Urgent Care

## 2015-07-07 VITALS — BP 96/68 | HR 85 | Temp 98.3°F | Resp 16 | Ht 60.5 in | Wt 121.8 lb

## 2015-07-07 DIAGNOSIS — R0982 Postnasal drip: Secondary | ICD-10-CM

## 2015-07-07 DIAGNOSIS — R059 Cough, unspecified: Secondary | ICD-10-CM

## 2015-07-07 DIAGNOSIS — J329 Chronic sinusitis, unspecified: Secondary | ICD-10-CM

## 2015-07-07 DIAGNOSIS — R0981 Nasal congestion: Secondary | ICD-10-CM

## 2015-07-07 DIAGNOSIS — R05 Cough: Secondary | ICD-10-CM

## 2015-07-07 MED ORDER — HYDROCODONE-HOMATROPINE 5-1.5 MG/5ML PO SYRP
5.0000 mL | ORAL_SOLUTION | Freq: Every evening | ORAL | Status: AC | PRN
Start: 2015-07-07 — End: ?

## 2015-07-07 MED ORDER — CETIRIZINE HCL 10 MG PO TABS: 10.0000 mg | ORAL_TABLET | Freq: Every day | ORAL | Status: AC

## 2015-07-07 MED ORDER — DOXYCYCLINE HYCLATE 100 MG PO CAPS: 100.0000 mg | ORAL_CAPSULE | Freq: Two times a day (BID) | ORAL | Status: AC

## 2015-07-07 MED ORDER — PSEUDOEPHEDRINE HCL ER 120 MG PO TB12: 120.0000 mg | ORAL_TABLET | Freq: Two times a day (BID) | ORAL | Status: AC

## 2015-07-07 NOTE — Progress Notes (Signed)
    MRN: [id number] DOB: 09-17-1986  Subjective:   Nancy Nicholson is a 29 y.o. female presenting for chief complaint of Cough; Nasal Congestion; and chest congestion  Reports 1 month history of intermittent headache, nasal congestion, productive cough worse in the morning. Now having intermittent subjective fever, sinus pain, ear fullness and pressure. Has tried Amoxicillin (  BID for 7 days) from one of her relatives with minimal relief. Has also tried Benadryl, Sudafed, ibuprofen or tylenol. Has a history of bronchitis, tonsilitis. Denies chest pain, shob, wheezing, n/v, abdominal pain, itchy or watery red eyes, throat pain, hemoptysis. Denies history of seasonal allergies. Denies any other aggravating or relieving factors, no other questions or concerns.  Nancy Nicholson has a current medication list which includes the following prescription(s): norethindrone-ethinyl estradiol, iron combinations, iron, prenatal vitamin w/fe, fa, and prenatal multivit-min-fe-fa. Also is allergic to eucalyptus oil; penicillins; and aloe.  Nancy Nicholson  has a past medical history of No pertinent past medical history and Placenta previa (u/s 07/13/11). Also  has past surgical history that includes No past surgeries and Cesarean section (07/17/2011).  Objective:   Vitals: BP 96/68 mmHg  Pulse 85  Temp(Src) 98.3 F (36.8 C) (Oral)  Resp 16  Ht 5' 0.5" (1.537 m)  Wt 121 lb 12.8 oz (55.248 kg)  BMI 23.39 kg/m2  SpO2 98%  LMP 06/24/2015  Physical Exam  Constitutional: She is oriented to person, place, and time. She appears well-developed and well-nourished.  HENT:  TM's intact bilaterally, no effusions or erythema. Nasal turbinates inflamed L>R with thick whitish mucus. Left nasal passage not patent. Bilateral maxillary sinus tenderness L>R. Postnasal drip present and tonsils enlarged but without oropharyngeal exudates, erythema or abscesses.  Eyes: No scleral icterus.  Neck: Normal range of motion.    Cardiovascular: Normal rate, regular rhythm and intact distal pulses.  Exam reveals no gallop and no friction rub.   No murmur heard. Pulmonary/Chest: No stridor. No respiratory distress. She has no wheezes. She has no rales.  Musculoskeletal: She exhibits no edema.  Lymphadenopathy:    She has cervical adenopathy (bilateral, anterior).  Neurological: She is alert and oriented to person, place, and time.  Skin: Skin is warm and dry. No rash noted. No erythema. No pallor.   Assessment and Plan :   1. Sinusitis, unspecified chronicity, unspecified location 2. Nasal congestion 3. Post-nasal drainage 4. Cough - Will cover for infectious process with doxycycline. She is not currently breast-feeding or training get pregnant, counseled on risks of using doxycycline. Start Zyrtec and pseudoephedrine, Hycodan for cough. Advised rest and plenty of hydration. Patient is to return to clinic in one week if no improvement.   Wallis Bamberg, PA-C [facility] [phone] 07/07/2015 10:08 AM

## 2015-07-07 NOTE — Patient Instructions (Signed)

## 2016-06-01 ENCOUNTER — Encounter (HOSPITAL_COMMUNITY): Payer: Self-pay | Admitting: Emergency Medicine

## 2016-06-01 ENCOUNTER — Ambulatory Visit (HOSPITAL_COMMUNITY)
Admission: EM | Admit: 2016-06-01 | Discharge: 2016-06-01 | Disposition: A | Discharge status: Home / Self Care | Payer: 59 | Attending: Family Medicine | Admitting: Family Medicine

## 2016-06-01 DIAGNOSIS — R1110 Vomiting, unspecified: Secondary | ICD-10-CM | POA: Diagnosis not present

## 2016-06-01 DIAGNOSIS — K2100 Gastro-esophageal reflux disease with esophagitis, without bleeding: Secondary | ICD-10-CM

## 2016-06-01 DIAGNOSIS — K529 Noninfective gastroenteritis and colitis, unspecified: Secondary | ICD-10-CM | POA: Diagnosis not present

## 2016-06-01 MED ORDER — OMEPRAZOLE 20 MG PO CPDR: 20.0000 mg | DELAYED_RELEASE_CAPSULE | Freq: Every day | ORAL | 0 refills | Status: AC

## 2016-06-01 MED ORDER — ONDANSETRON 4 MG PO TBDP: 4.0000 mg | ORAL_TABLET | Freq: Three times a day (TID) | ORAL | 0 refills | Status: AC | PRN

## 2016-06-01 NOTE — ED Provider Notes (Signed)
CSN: [id number]     Arrival date & time 06/01/16  1011 History   First MD Initiated Contact with Patient 06/01/16 1128     No chief complaint on file.  (Consider location/radiation/quality/duration/timing/severity/associated sxs/prior Treatment) Patient developed GERD and nausea and has been vomiting.  She states she feels sick.   The history is provided by the patient.  Emesis  Severity:  Moderate Duration:  1 day Timing:  Intermittent Quality:  Stomach contents Able to tolerate:  Liquids How soon after eating does vomiting occur:  1 hour Progression:  Unchanged Chronicity:  New Recent urination:  Normal Relieved by:  Nothing Worsened by:  Nothing Ineffective treatments:  None tried   Past Medical History:  Diagnosis Date  . No pertinent past medical history   . Placenta previa u/s 07/13/11   with present preg-no problems   Past Surgical History:  Procedure Laterality Date  . CESAREAN SECTION  07/17/2011   Procedure: CESAREAN SECTION;  Surgeon: Bing Plume, MD;  Location: [facility];  Service: Gynecology;  Laterality: N/A;  Primary Cesarean Section  . NO PAST SURGERIES     Family History  Problem Relation Age of Onset  . Hypertension Mother   . Hypertension Father   . Hyperlipidemia Father   . Diabetes Maternal Grandfather   . Hyperlipidemia Maternal Grandfather   . Hypertension Maternal Grandfather   . Heart disease Paternal Grandmother   . Hyperlipidemia Paternal Grandmother   . Hypertension Paternal Grandmother   . Heart disease Paternal Grandfather   . Hyperlipidemia Paternal Grandfather   . Hypertension Paternal Grandfather    Social History  Substance Use Topics  . Smoking status: Former Smoker    Types: Cigarettes    Quit date: 12/08/2010  . Smokeless tobacco: Not on file  . Alcohol use No   OB History    Gravida Para Term Preterm AB Living   1 1   1   1    SAB TAB Ectopic Multiple Live Births                 Review of Systems  Constitutional:  Negative.   HENT: Negative.   Eyes: Negative.   Respiratory: Negative.   Cardiovascular: Negative.   Gastrointestinal: Positive for nausea and vomiting.  Endocrine: Negative.   Genitourinary: Negative.   Musculoskeletal: Negative.   Allergic/Immunologic: Negative.   Neurological: Negative.   Hematological: Negative.   Psychiatric/Behavioral: Negative.     Allergies  Eucalyptus oil; Penicillins; and Aloe  Home Medications   Prior to Admission medications   Medication Sig Start Date End Date Taking? Authorizing Provider  cetirizine (ZYRTEC) 10 MG tablet Take 1 tablet (10 mg total) by mouth daily. 07/07/15   Wallis Bamberg, PA-C  doxycycline (VIBRAMYCIN) 100 MG capsule Take 1 capsule (100 mg total) by mouth 2 (two) times daily. 07/07/15   Wallis Bamberg, PA-C  HYDROcodone-homatropine [facility]) 5-1.5 MG/5ML syrup Take 5 mLs by mouth at bedtime as needed. 07/07/15   Wallis Bamberg, PA-C  Iron Combinations (IRON COMPLEX PO) Take by mouth.      Historical Provider, MD  IRON PO Take 1 tablet by mouth daily.      Historical Provider, MD  norethindrone-ethinyl estradiol (MICROGESTIN,JUNEL,LOESTRIN) 1-20 MG-MCG tablet Take 1 tablet by mouth daily.    Historical Provider, MD  prenatal vitamin w/FE, FA (PRENATAL 1 + 1) 27-1 MG TABS Take 1 tablet by mouth daily.      Historical Provider, MD  PRENATAL VITAMINS PO Take by mouth.  Historical Provider, MD  pseudoephedrine (SUDAFED 12 HOUR) 120 MG 12 hr tablet Take 1 tablet (120 mg total) by mouth 2 (two) times daily. 07/07/15   Wallis Bamberg, PA-C   Meds Ordered and Administered this Visit  Medications - No data to display  There were no vitals taken for this visit. No data found.   Physical Exam  Constitutional: She appears well-developed and well-nourished.  HENT:  Head: Normocephalic and atraumatic.  Eyes: Conjunctivae are normal. Pupils are equal, round, and reactive to light.  Neck: Normal range of motion. Neck supple.  Cardiovascular: Normal rate,  regular rhythm and normal heart sounds.   Pulmonary/Chest: Effort normal and breath sounds normal.  Abdominal: Soft. Bowel sounds are normal.    Urgent Care Course   Clinical Course    Procedures (including critical care time)  Labs Review Labs Reviewed - No data to display  Imaging Review No results found.   Visual Acuity Review  Right Eye Distance:   Left Eye Distance:   Bilateral Distance:    Right Eye Near:   Left Eye Near:    Bilateral Near:         MDM   Viral Gastroenteritis - Zofran  po tid prn #20 Genella Rife - prilosec  one po qd #14 Push po fluids, rest, tylenol and motrin otc prn as directed for fever, arthralgias, and myalgias.  Follow up prn if sx's continue or persist.    Deatra Canter, FNP 06/01/16 2020

## 2016-06-01 NOTE — ED Triage Notes (Signed)
Complains of indigestion and vomiting intermittently since Thursday night.

## 2016-06-11 ENCOUNTER — Ambulatory Visit (HOSPITAL_COMMUNITY)
Admission: EM | Admit: 2016-06-11 | Discharge: 2016-06-11 | Disposition: A | Discharge status: Home / Self Care | Payer: 59 | Attending: Family Medicine | Admitting: Family Medicine

## 2016-06-11 ENCOUNTER — Encounter (HOSPITAL_COMMUNITY): Payer: Self-pay | Admitting: Emergency Medicine

## 2016-06-11 DIAGNOSIS — K5901 Slow transit constipation: Secondary | ICD-10-CM

## 2016-06-11 DIAGNOSIS — R1011 Right upper quadrant pain: Secondary | ICD-10-CM

## 2016-06-11 NOTE — ED Provider Notes (Signed)
CSN: [id number]     Arrival date & time 06/11/16  [id number] History   None    Chief Complaint  Patient presents with  . Abdominal Pain   (Consider location/radiation/quality/duration/timing/severity/associated sxs/prior Treatment) 30 year old female is complaining of right upper quadrant pain. She was seen in this urgent care approximately 2 weeks ago with pain across the upper and lower abdomen associated with nausea and diarrhea. She was diagnosed with gastroenteritis and treated with omeprazole and Zofran. Many for symptoms abated and her upper GI complaints have much improved. She is now complaining of persistent right upper quadrant pain that sometimes better with ibuprofen. Otherwise, the pain is constant. It is not associated with nausea, vomiting fever or chills at this time. The pain is worse when eating greasy foods and drinking sodas.  She also is complaining of constipation and having to strain stools. She had a BM yesterday but she was training and did not feel like she had a complete emptying BM.  She has an appointment with Dr. Denese KillingsElizabeth Dooley next month as a new patient. Does not currently have a PCP.      Past Medical History:  Diagnosis Date  . No pertinent past medical history   . Placenta previa u/s 07/13/11   with present preg-no problems   Past Surgical History:  Procedure Laterality Date  . CESAREAN SECTION  07/17/2011   Procedure: CESAREAN SECTION;  Surgeon: Bing Plumehomas F Henley, MD;  Location: [facility];  Service: Gynecology;  Laterality: N/A;  Primary Cesarean Section  . NO PAST SURGERIES     Family History  Problem Relation Age of Onset  . Hypertension Mother   . Hypertension Father   . Hyperlipidemia Father   . Diabetes Maternal Grandfather   . Hyperlipidemia Maternal Grandfather   . Hypertension Maternal Grandfather   . Heart disease Paternal Grandmother   . Hyperlipidemia Paternal Grandmother   . Hypertension Paternal Grandmother   . Heart disease Paternal  Grandfather   . Hyperlipidemia Paternal Grandfather   . Hypertension Paternal Grandfather    Social History  Substance Use Topics  . Smoking status: Former Smoker    Types: Cigarettes    Quit date: 12/08/2010  . Smokeless tobacco: Former NeurosurgeonUser  . Alcohol use No   OB History    Gravida Para Term Preterm AB Living   1 1   1   1    SAB TAB Ectopic Multiple Live Births           1     Review of Systems  Constitutional: Negative.  Negative for fatigue and fever.  HENT: Negative.   Eyes: Negative.   Respiratory: Negative.  Negative for cough and shortness of breath.   Cardiovascular: Negative for chest pain.  Gastrointestinal: Positive for abdominal pain, constipation and nausea. Negative for abdominal distention and vomiting.  Genitourinary: Negative for dysuria, frequency, pelvic pain, vaginal bleeding and vaginal discharge.  Skin: Negative.   Neurological: Negative.   Psychiatric/Behavioral: Negative.     Allergies  Eucalyptus oil; Penicillins; and Aloe  Home Medications   Prior to Admission medications   Medication Sig Start Date End Date Taking? Authorizing Provider  omeprazole (PRILOSEC) 20 MG capsule Take 1 capsule (20 mg total) by mouth daily. 06/01/16  Yes Deatra CanterWilliam J Oxford, FNP  ondansetron (ZOFRAN ODT) 4 MG disintegrating tablet Take 1 tablet (4 mg total) by mouth every 8 (eight) hours as needed for nausea or vomiting. 06/01/16  Yes Deatra CanterWilliam J Oxford, FNP  cetirizine (ZYRTEC) 10 MG tablet  Take 1 tablet (10 mg total) by mouth daily. 07/07/15   Wallis Bamberg, PA-C  dimenhyDRINATE (DRAMAMINE) 50 MG tablet Take 50 mg by mouth every 8 (eight) hours as needed.    Historical Provider, MD  doxycycline (VIBRAMYCIN) 100 MG capsule Take 1 capsule (100 mg total) by mouth 2 (two) times daily. 07/07/15   Wallis Bamberg, PA-C  HYDROcodone-homatropine [facility]) 5-1.5 MG/5ML syrup Take 5 mLs by mouth at bedtime as needed. 07/07/15   Wallis Bamberg, PA-C  Iron Combinations (IRON COMPLEX PO) Take by mouth.       Historical Provider, MD  IRON PO Take 1 tablet by mouth daily.      Historical Provider, MD  norethindrone-ethinyl estradiol (MICROGESTIN,JUNEL,LOESTRIN) 1-20 MG-MCG tablet Take 1 tablet by mouth daily.    Historical Provider, MD  prenatal vitamin w/FE, FA (PRENATAL 1 + 1) 27-1 MG TABS Take 1 tablet by mouth daily.      Historical Provider, MD  PRENATAL VITAMINS PO Take by mouth.      Historical Provider, MD  pseudoephedrine (SUDAFED 12 HOUR) 120 MG 12 hr tablet Take 1 tablet (120 mg total) by mouth 2 (two) times daily. 07/07/15   Wallis Bamberg, PA-C   Meds Ordered and Administered this Visit  Medications - No data to display  Pulse 77   Temp 98.9 F (37.2 C) (Oral)   LMP 06/04/2016   SpO2 100%  No data found.   Physical Exam  Constitutional: She is oriented to person, place, and time. She appears well-developed and well-nourished. No distress.  HENT:  Head: Normocephalic and atraumatic.  Eyes: EOM are normal.  Neck: Normal range of motion. Neck supple.  Cardiovascular: Normal rate, regular rhythm, normal heart sounds and intact distal pulses.   Pulmonary/Chest: Effort normal and breath sounds normal. No respiratory distress. She has no wheezes. She has no rales. She exhibits no tenderness.  Abdominal: Soft. Bowel sounds are normal. She exhibits no mass. There is tenderness. There is guarding.  Right upper quadrant tenderness. Positive Murphy sign.  Upper half of the abdomen percusses tympanic. Lower half is dull to flat. Bowel sounds are normal. No palpable masses.  Musculoskeletal: She exhibits no edema.  Neurological: She is alert and oriented to person, place, and time. She exhibits normal muscle tone.  Skin: Skin is warm and dry. No rash noted.  Psychiatric: She has a normal mood and affect.  Nursing note and vitals reviewed.   Urgent Care Course   Clinical Course    Procedures (including critical care time)  Labs Review Labs Reviewed - No data to display  Imaging  Review No results found.   Visual Acuity Review  Right Eye Distance:   Left Eye Distance:   Bilateral Distance:    Right Eye Near:   Left Eye Near:    Bilateral Near:         MDM   1. RUQ abdominal pain   2. Slow transit constipation    Abdominal Pain, Adult The likely cause of your abdominal pain is due to gallbladder problems. Avoid fatty foods, drink plenty of clear liquids and stay well-hydrated. For any worsening, increased pain, vomiting, inability to hold down liquids and food and especially if you have a fever directly to the emergency department promptly. Otherwise keep appointment with Dr. Duanne Guess as above. Read instructions regarding abdominal pain to include warning signs. Constipation may take MiraLAX as directed and drink plenty of fluids.    Hayden Rasmussen, NP 06/11/16 1104

## 2016-06-11 NOTE — ED Triage Notes (Signed)
Pt here for RUQ pain onset x2 weeks... Seen here on 7/31 for similar sx... Reports pain increases w/pressure and relieved by Ibuprofen temp.... Denies f/v/n/d.... A&O x4... NAD

## 2016-06-17 ENCOUNTER — Other Ambulatory Visit: Payer: Self-pay | Admitting: Family Medicine

## 2016-06-17 DIAGNOSIS — R1011 Right upper quadrant pain: Secondary | ICD-10-CM

## 2016-06-18 ENCOUNTER — Ambulatory Visit
Admission: RE | Admit: 2016-06-18 | Discharge: 2016-06-18 | Disposition: A | Discharge status: Home / Self Care | Payer: 59 | Source: Ambulatory Visit | Attending: Family Medicine | Admitting: Family Medicine

## 2016-06-18 ENCOUNTER — Other Ambulatory Visit: Payer: 59

## 2016-06-18 DIAGNOSIS — R1011 Right upper quadrant pain: Secondary | ICD-10-CM

## 2016-09-22 ENCOUNTER — Encounter: Payer: 59 | Admitting: Internal Medicine

## 2018-02-25 IMAGING — US US ABDOMEN COMPLETE
1 series · 13 of 25 positions shown · non-contrast
Comparison: No prior ultrasound. Abdominal x-ray performed
concurrently.

CLINICAL DATA: Intermittent right upper quadrant abdominal pain,
nausea and vomiting, and constipation over the past 3 weeks. Nausea
and vomiting occurs after greasy foods.

EXAM:
ABDOMEN ULTRASOUND COMPLETE

[Series 1: us abdomen complete · 0.21mm/px · 13 of 77 slices shown]
[im 1/77]
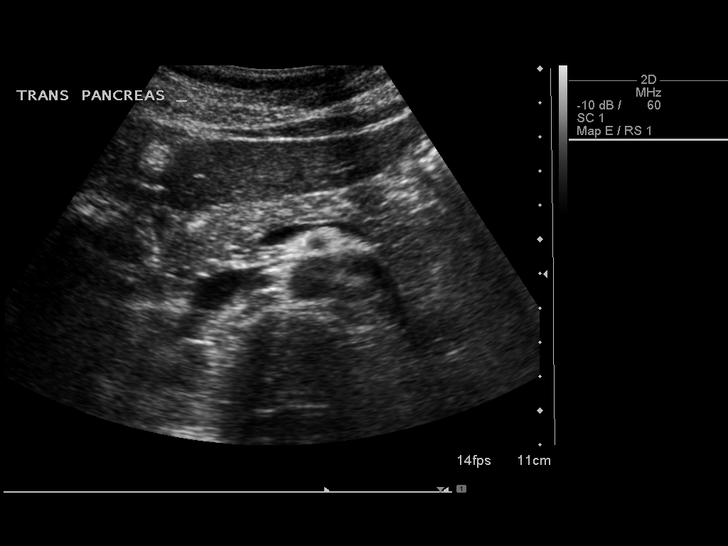
[im 7/77]
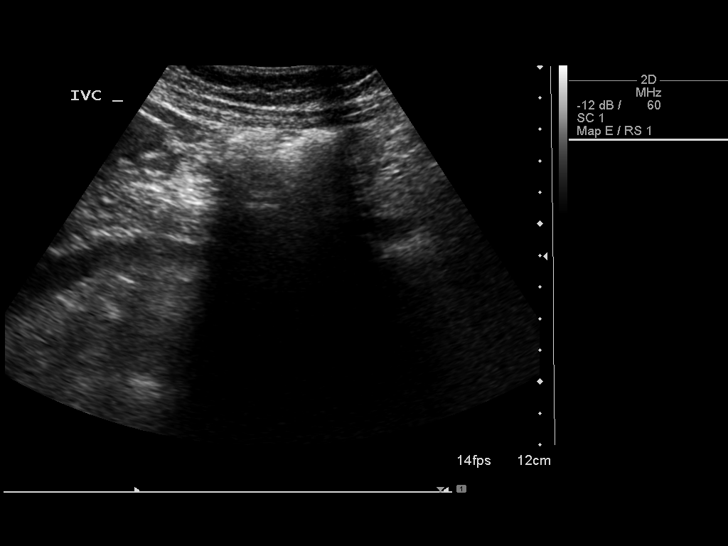
[im 13/77]
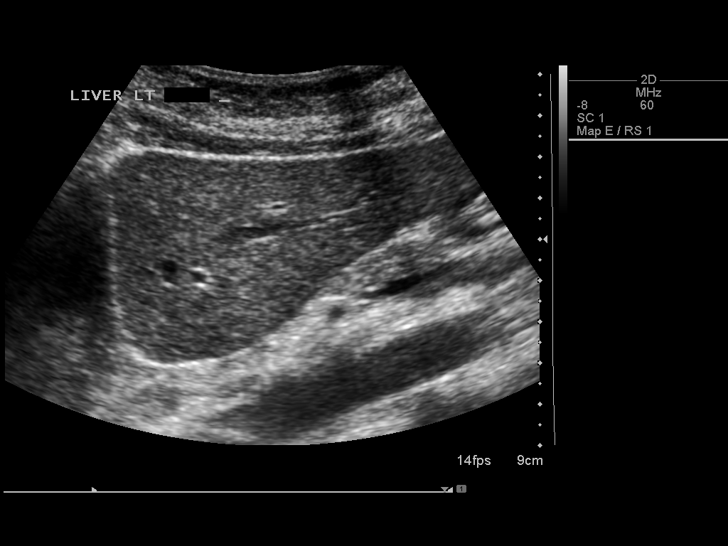
[im 20/77]
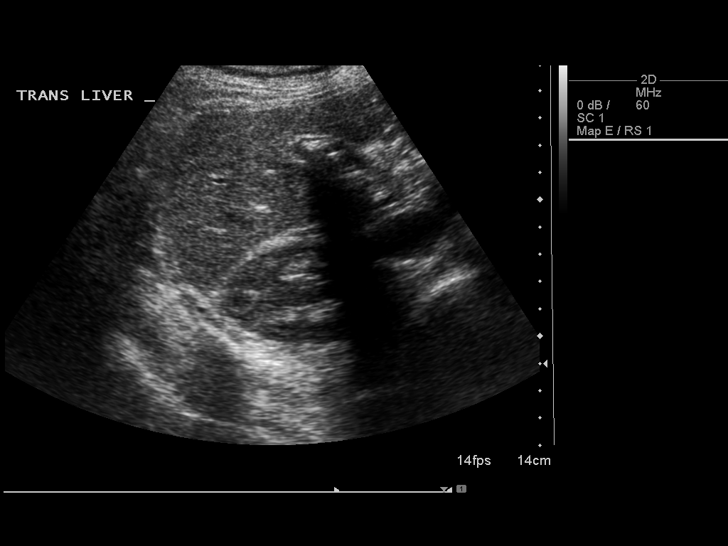
[im 26/77]
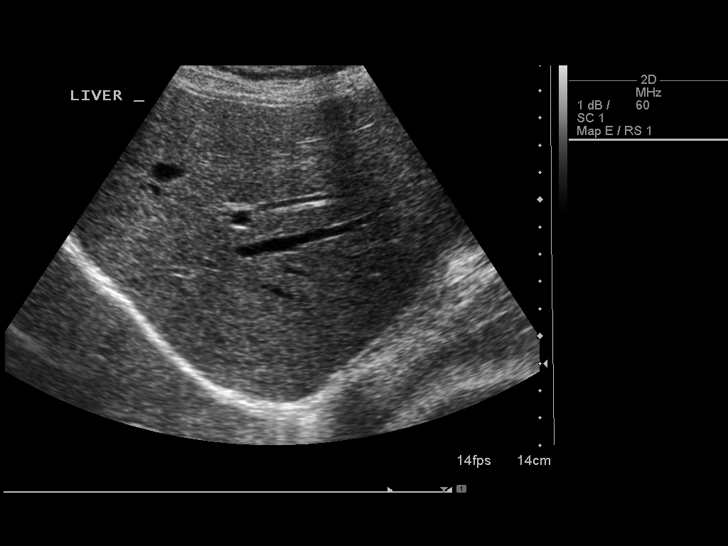
[im 32/77]
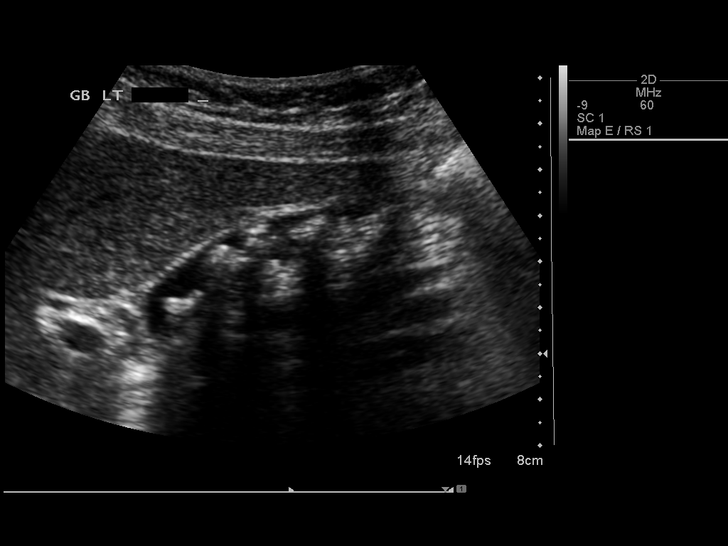
[im 39/77]
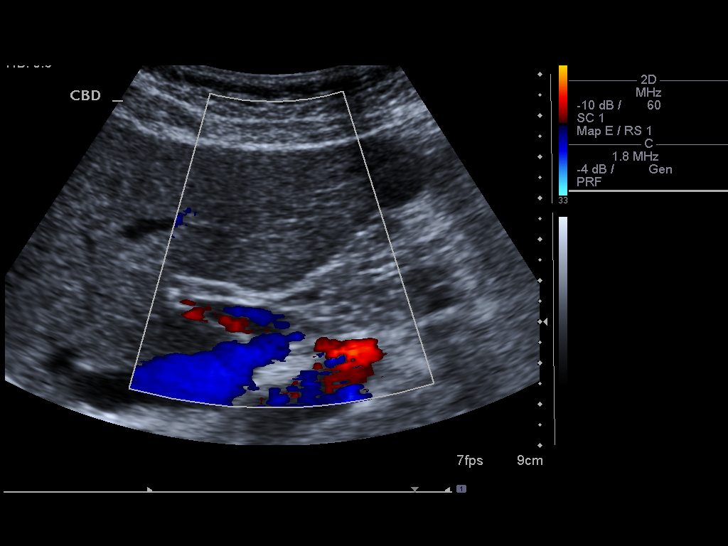
[im 45/77]
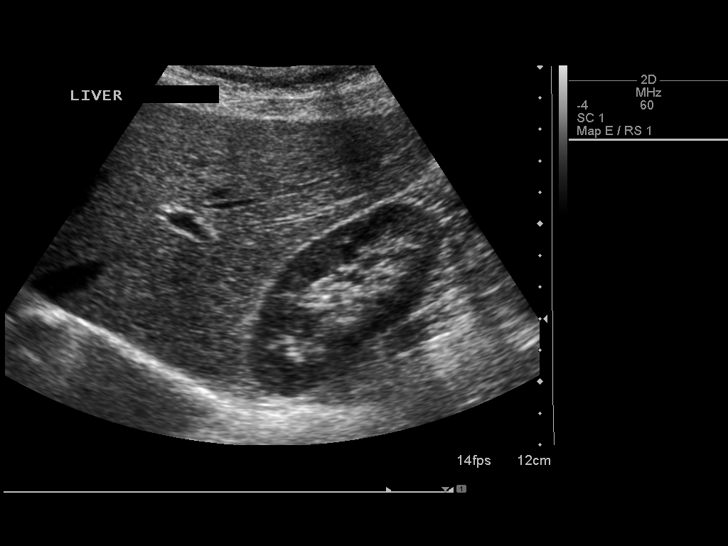
[im 51/77]
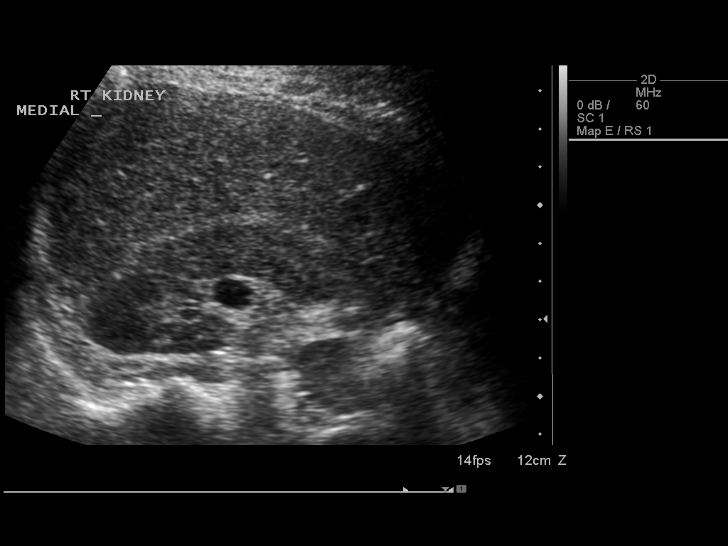
[im 58/77]
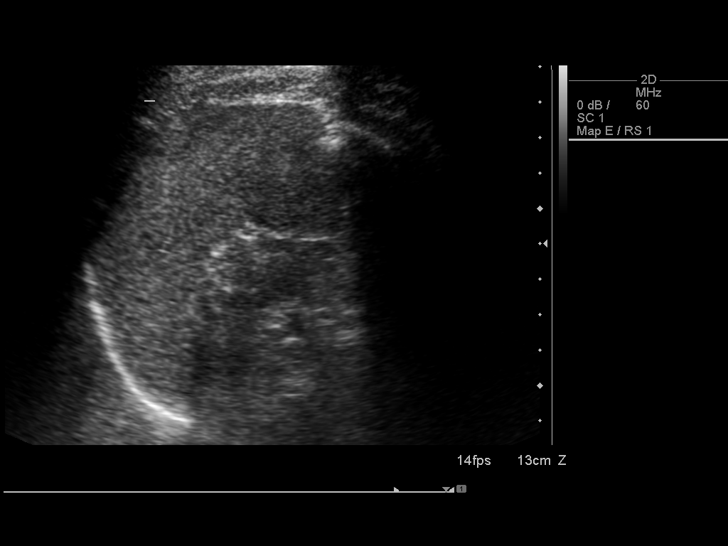
[im 64/77]
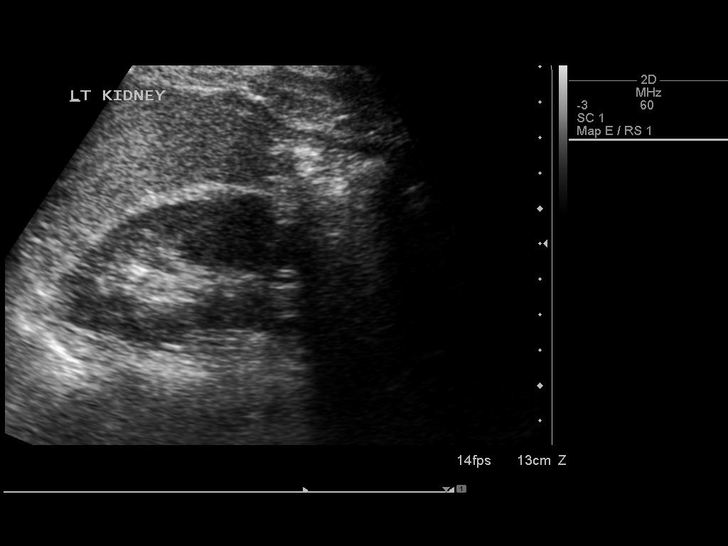
[im 70/77]
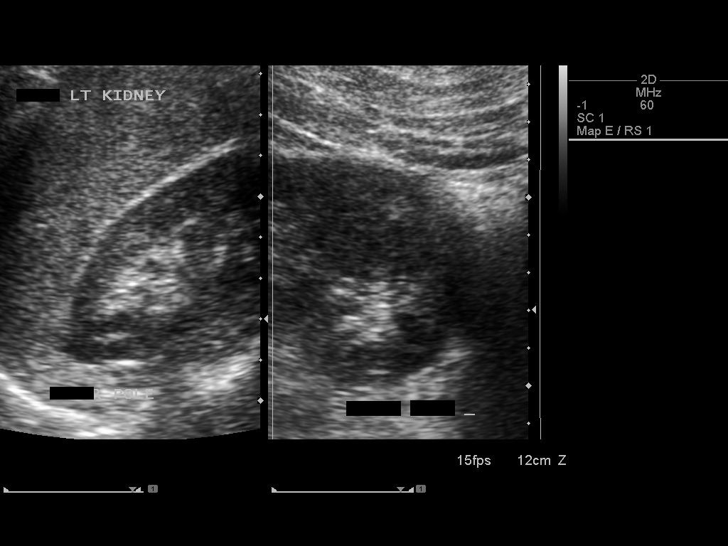
[im 77/77]
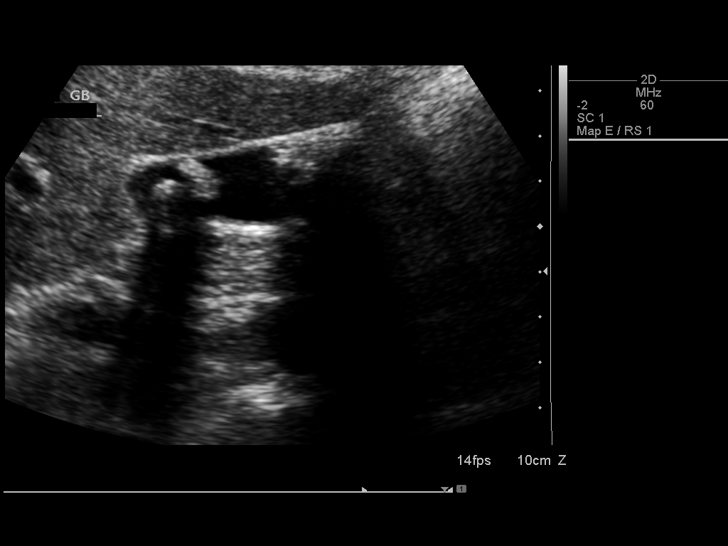

[13 of 25 positions shown; findings below may reference images not displayed]

FINDINGS: Gallbladder: Contracted gallbladder containing numerous shadowing
gallstones, the largest on the order of 8 mm. No gallbladder wall
thickening or pericholecystic fluid. Negative sonographic Murphy
sign according to the ultrasound technologist.

Common bile duct: Diameter: Approximately 2 mm.

Liver: Normal size and echotexture without focal parenchymal
abnormality. Patent portal vein with hepatopetal flow.

IVC: Patent P

Pancreas: Normal size and echotexture without focal parenchymal
abnormality.

Spleen: Normal size and echotexture without focal parenchymal
abnormality.

Right Kidney: Length: Approximately 8.5 cm. No hydronephrosis.
Well-preserved cortex. No shadowing calculi. Normal parenchymal
echotexture. No focal parenchymal abnormality.

Left Kidney: Length: Approximately 9.1 cm. No hydronephrosis.
Well-preserved cortex. No shadowing calculi. Normal parenchymal
echotexture. No focal parenchymal abnormality.

Abdominal aorta: Normal in caliber throughout its visualized course
in the abdomen without evidence of significant atherosclerosis.
Maximum diameter 1.8 cm.

Other findings: None.
IMPRESSION: 1. Cholelithiasis without sonographic evidence of acute
cholecystitis.
2. Otherwise normal examination.
